# Patient Record
Sex: Female | Born: 1975 | Hispanic: Yes | Marital: Married | Smoking: Former smoker
Health system: Southern US, Community
[De-identification: ages and names within clinical notes are randomized; demographics above are authoritative.]

## PROBLEM LIST (undated history)

## (undated) DIAGNOSIS — I10 Essential (primary) hypertension: Secondary | ICD-10-CM

## (undated) DIAGNOSIS — K219 Gastro-esophageal reflux disease without esophagitis: Secondary | ICD-10-CM

## (undated) HISTORY — PX: OTHER SURGICAL HISTORY: SHX169

## (undated) HISTORY — PX: LAPAROSCOPIC GASTRIC SLEEVE RESECTION: SHX5895

## (undated) HISTORY — PX: PILONIDAL CYST EXCISION: SHX744

## (undated) HISTORY — PX: CHOLECYSTECTOMY: SHX55

## (undated) HISTORY — PX: GALLBLADDER SURGERY: SHX652

---

## 2005-12-23 ENCOUNTER — Other Ambulatory Visit: Payer: Self-pay

## 2005-12-23 ENCOUNTER — Emergency Department: Payer: Self-pay | Admitting: Emergency Medicine

## 2005-12-29 ENCOUNTER — Emergency Department: Payer: Self-pay | Admitting: Internal Medicine

## 2006-02-10 ENCOUNTER — Emergency Department: Payer: Self-pay | Admitting: Emergency Medicine

## 2006-04-21 ENCOUNTER — Emergency Department: Payer: Self-pay | Admitting: Emergency Medicine

## 2006-06-06 ENCOUNTER — Emergency Department: Payer: Self-pay | Admitting: Emergency Medicine

## 2006-08-31 ENCOUNTER — Emergency Department: Payer: Self-pay | Admitting: Emergency Medicine

## 2007-05-19 ENCOUNTER — Emergency Department: Payer: Self-pay | Admitting: Emergency Medicine

## 2007-07-13 ENCOUNTER — Emergency Department: Payer: Self-pay | Admitting: Emergency Medicine

## 2008-02-23 ENCOUNTER — Emergency Department: Payer: Self-pay | Admitting: Emergency Medicine

## 2008-03-12 ENCOUNTER — Emergency Department: Payer: Self-pay | Admitting: Emergency Medicine

## 2008-04-14 ENCOUNTER — Emergency Department: Payer: Self-pay | Admitting: Emergency Medicine

## 2011-02-24 HISTORY — PX: LAPAROSCOPIC GASTRIC SLEEVE RESECTION: SHX5895

## 2013-02-22 ENCOUNTER — Emergency Department: Payer: Self-pay | Admitting: Emergency Medicine

## 2013-03-07 ENCOUNTER — Encounter (HOSPITAL_COMMUNITY): Payer: Self-pay | Admitting: Emergency Medicine

## 2013-03-07 ENCOUNTER — Emergency Department (HOSPITAL_COMMUNITY)
Admission: EM | Admit: 2013-03-07 | Discharge: 2013-03-07 | Disposition: A | Discharge status: Home / Self Care | Payer: No Typology Code available for payment source | Attending: Emergency Medicine | Admitting: Emergency Medicine

## 2013-03-07 ENCOUNTER — Emergency Department (HOSPITAL_COMMUNITY): Payer: No Typology Code available for payment source

## 2013-03-07 DIAGNOSIS — Z91040 Latex allergy status: Secondary | ICD-10-CM | POA: Insufficient documentation

## 2013-03-07 DIAGNOSIS — R110 Nausea: Secondary | ICD-10-CM | POA: Insufficient documentation

## 2013-03-07 DIAGNOSIS — R42 Dizziness and giddiness: Secondary | ICD-10-CM | POA: Insufficient documentation

## 2013-03-07 DIAGNOSIS — IMO0002 Reserved for concepts with insufficient information to code with codable children: Secondary | ICD-10-CM | POA: Insufficient documentation

## 2013-03-07 DIAGNOSIS — Y92410 Unspecified street and highway as the place of occurrence of the external cause: Secondary | ICD-10-CM | POA: Insufficient documentation

## 2013-03-07 DIAGNOSIS — S0993XA Unspecified injury of face, initial encounter: Secondary | ICD-10-CM | POA: Insufficient documentation

## 2013-03-07 DIAGNOSIS — S199XXA Unspecified injury of neck, initial encounter: Secondary | ICD-10-CM

## 2013-03-07 DIAGNOSIS — T148XXA Other injury of unspecified body region, initial encounter: Secondary | ICD-10-CM

## 2013-03-07 DIAGNOSIS — Y9389 Activity, other specified: Secondary | ICD-10-CM | POA: Insufficient documentation

## 2013-03-07 DIAGNOSIS — F17200 Nicotine dependence, unspecified, uncomplicated: Secondary | ICD-10-CM | POA: Insufficient documentation

## 2013-03-07 DIAGNOSIS — Z8632 Personal history of gestational diabetes: Secondary | ICD-10-CM | POA: Insufficient documentation

## 2013-03-07 MED ORDER — TRAMADOL HCL 50 MG PO TABS: 50.0000 mg | ORAL_TABLET | Freq: Four times a day (QID) | ORAL | Status: DC | PRN

## 2013-03-07 MED ORDER — HYDROCODONE-ACETAMINOPHEN 5-325 MG PO TABS
1.0000 | ORAL_TABLET | Freq: Once | ORAL | Status: AC
Start: 2013-03-07 — End: 2013-03-07
  Administered 2013-03-07: 1 via ORAL
  Filled 2013-03-07: qty 1

## 2013-03-07 MED ORDER — CYCLOBENZAPRINE HCL 10 MG PO TABS: 10.0000 mg | ORAL_TABLET | Freq: Three times a day (TID) | ORAL | Status: DC | PRN

## 2013-03-07 MED ORDER — ONDANSETRON 8 MG PO TBDP
8.0000 mg | ORAL_TABLET | Freq: Once | ORAL | Status: AC
  Administered 2013-03-07: 8 mg via ORAL
  Filled 2013-03-07: qty 1

## 2013-03-07 MED ORDER — IBUPROFEN 800 MG PO TABS
800.0000 mg | ORAL_TABLET | Freq: Once | ORAL | Status: DC
  Filled 2013-03-07: qty 1
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
## 2013-03-07 NOTE — ED Notes (Signed)
Spinal board removed from patient. She has mid to lower back pain upon palpation. She c/o lt shoulder pain.
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
## 2013-03-07 NOTE — ED Provider Notes (Signed)
Medical screening examination/treatment/procedure(s) were performed by non-physician practitioner and as supervising physician I was immediately available for consultation/collaboration.  EKG Interpretation   None         Cathye Kreiter M Deyra Perdomo, DO 03/07/13 1532 

## 2013-03-07 NOTE — ED Notes (Signed)
Bed: WA22 Expected date:  Expected time:  Means of arrival:  Comments: EMS-[facility] 

## 2013-06-21 ENCOUNTER — Emergency Department: Payer: Self-pay | Admitting: Emergency Medicine

## 2013-06-21 LAB — URINALYSIS, COMPLETE
BLOOD: NEGATIVE
Bacteria: NONE SEEN
Bilirubin,UR: NEGATIVE
Glucose,UR: NEGATIVE mg/dL (ref 0–75)
Ketone: NEGATIVE
Nitrite: NEGATIVE
PH: 5 (ref 4.5–8.0)
Protein: NEGATIVE
RBC,UR: 3 /HPF (ref 0–5)
Specific Gravity: 1.023 (ref 1.003–1.030)
Squamous Epithelial: 4
WBC UR: 2 /HPF (ref 0–5)

## 2013-06-21 LAB — COMPREHENSIVE METABOLIC PANEL
ALT: 43 U/L (ref 12–78)
Albumin: 3.6 g/dL (ref 3.4–5.0)
Alkaline Phosphatase: 48 U/L
Anion Gap: 8 (ref 7–16)
BILIRUBIN TOTAL: 0.6 mg/dL (ref 0.2–1.0)
BUN: 6 mg/dL — AB (ref 7–18)
CHLORIDE: 105 mmol/L (ref 98–107)
CO2: 24 mmol/L (ref 21–32)
Calcium, Total: 8.9 mg/dL (ref 8.5–10.1)
Creatinine: 0.53 mg/dL — ABNORMAL LOW (ref 0.60–1.30)
EGFR (Non-African Amer.): >60
Glucose: 76 mg/dL (ref 65–99)
Osmolality: 270 (ref 275–301)
Potassium: 3.8 mmol/L (ref 3.5–5.1)
SGOT(AST): 21 U/L (ref 15–37)
Sodium: 137 mmol/L (ref 136–145)
Total Protein: 7.8 g/dL (ref 6.4–8.2)

## 2013-06-21 LAB — HCG, QUANTITATIVE, PREGNANCY: BETA HCG, QUANT.: 131184 m[IU]/mL — AB

## 2013-06-21 LAB — CBC
HCT: 42.4 % (ref 35.0–47.0)
HGB: 14.3 g/dL (ref 12.0–16.0)
MCH: 31 pg (ref 26.0–34.0)
MCHC: 33.8 g/dL (ref 32.0–36.0)
MCV: 92 fL (ref 80–100)
PLATELETS: 192 10*3/uL (ref 150–440)
RBC: 4.61 10*6/uL (ref 3.80–5.20)
RDW: 12.3 % (ref 11.5–14.5)
WBC: 9.7 10*3/uL (ref 3.6–11.0)

## 2013-06-21 LAB — GC/CHLAMYDIA PROBE AMP

## 2013-06-21 LAB — WET PREP, GENITAL

## 2013-11-20 ENCOUNTER — Emergency Department: Payer: Self-pay | Admitting: Emergency Medicine

## 2013-12-05 ENCOUNTER — Emergency Department: Payer: Self-pay | Admitting: Emergency Medicine

## 2014-01-16 DIAGNOSIS — H5213 Myopia, bilateral: Secondary | ICD-10-CM | POA: Insufficient documentation

## 2014-03-26 DIAGNOSIS — R079 Chest pain, unspecified: Secondary | ICD-10-CM | POA: Insufficient documentation

## 2014-04-30 DIAGNOSIS — Z3009 Encounter for other general counseling and advice on contraception: Secondary | ICD-10-CM | POA: Insufficient documentation

## 2014-04-30 DIAGNOSIS — N760 Acute vaginitis: Secondary | ICD-10-CM | POA: Insufficient documentation

## 2014-05-07 DIAGNOSIS — R87610 Atypical squamous cells of undetermined significance on cytologic smear of cervix (ASC-US): Secondary | ICD-10-CM | POA: Insufficient documentation

## 2014-11-13 DIAGNOSIS — F4310 Post-traumatic stress disorder, unspecified: Secondary | ICD-10-CM | POA: Insufficient documentation

## 2014-11-13 DIAGNOSIS — F332 Major depressive disorder, recurrent severe without psychotic features: Secondary | ICD-10-CM | POA: Insufficient documentation

## 2014-11-28 ENCOUNTER — Encounter: Payer: Self-pay | Admitting: *Deleted

## 2014-11-28 DIAGNOSIS — Z91040 Latex allergy status: Secondary | ICD-10-CM | POA: Insufficient documentation

## 2014-11-28 DIAGNOSIS — T8131XA Disruption of external operation (surgical) wound, not elsewhere classified, initial encounter: Secondary | ICD-10-CM | POA: Insufficient documentation

## 2014-11-28 DIAGNOSIS — Y838 Other surgical procedures as the cause of abnormal reaction of the patient, or of later complication, without mention of misadventure at the time of the procedure: Secondary | ICD-10-CM | POA: Insufficient documentation

## 2014-11-28 DIAGNOSIS — Z79899 Other long term (current) drug therapy: Secondary | ICD-10-CM | POA: Insufficient documentation

## 2014-11-28 DIAGNOSIS — Z880 Allergy status to penicillin: Secondary | ICD-10-CM | POA: Insufficient documentation

## 2014-11-28 LAB — CBC
HCT: 37.5 % (ref 35.0–47.0)
HEMOGLOBIN: 12.8 g/dL (ref 12.0–16.0)
MCH: 30.6 pg (ref 26.0–34.0)
MCHC: 34.2 g/dL (ref 32.0–36.0)
MCV: 89.5 fL (ref 80.0–100.0)
Platelets: 275 10*3/uL (ref 150–440)
RBC: 4.19 MIL/uL (ref 3.80–5.20)
RDW: 12.8 % (ref 11.5–14.5)
WBC: 9.3 10*3/uL (ref 3.6–11.0)

## 2014-11-28 NOTE — ED Notes (Signed)
Pt reports that she had a laparoscopic procedure for a tubal done on the 27th at [facility]. Pt presents tonight c/o lower abdominal pain and bleeding from the laparoscopic site just below her naval. She said she just placed the dressing about 5 minutes ago and it was noted to be saturated in triage. New dressing placed.

## 2014-11-29 ENCOUNTER — Emergency Department
Admission: EM | Admit: 2014-11-29 | Discharge: 2014-11-29 | Disposition: A | Discharge status: Home / Self Care | Payer: Self-pay | Attending: Emergency Medicine | Admitting: Emergency Medicine

## 2014-11-29 DIAGNOSIS — T8131XA Disruption of external operation (surgical) wound, not elsewhere classified, initial encounter: Secondary | ICD-10-CM

## 2014-11-29 MED ORDER — ACETAMINOPHEN 325 MG PO TABS
650.0000 mg | ORAL_TABLET | Freq: Once | ORAL | Status: AC
  Administered 2014-11-29: 650 mg via ORAL
  Filled 2014-11-29: qty 2

## 2014-11-29 NOTE — ED Provider Notes (Signed)
[facility] Emergency Department Provider Note   ____________________________________________  Time seen: 64  I have reviewed the triage vital signs and the nursing notes.   HISTORY  Chief Complaint Post-op Problem   History limited by: Not Limited   HPI Ana Orr is a 39 y.o. female who presented to the emergency department today with concerns for bleeding from an umbilical incision site. The patient states that she had the operation performed roughly 1 week ago. She was supposed to get a tubal ligation however after the umbilical port site was incised they stopped the surgery stating the morning unable to get past the muscle according to the patient. She states that since that time she has had pain around that incision site. Today she started noticing some bleeding from that site. She denies having any bleeding disorders or being on any blood thinners. She doesn't smoking. No fevers.   Past Medical History  Diagnosis Date  . Gestational diabetes     There are no active problems to display for this patient.   Past Surgical History  Procedure Laterality Date  . Laparoscopic gastric sleeve resection    . Left breast augmentation    . Pilonidal cyst excision    . Cholecystectomy    . Tummy tuck      Current Outpatient Rx  Name  Route  Sig  Dispense  Refill  . docusate sodium (COLACE) 100 MG capsule   Oral   Take 1 capsule by mouth 2 (two) times daily.      0   . hydrOXYzine (ATARAX/VISTARIL) 25 MG tablet   Oral   Take 1 tablet by mouth every 6 (six) hours.      0   . ibuprofen (ADVIL,MOTRIN) 800 MG tablet   Oral   Take 1 tablet by mouth every 8 (eight) hours as needed.      0   . oxyCODONE-acetaminophen (PERCOCET/ROXICET) 5-325 MG tablet   Oral   Take 1-2 tablets by mouth every 4 (four) hours as needed.      0   . promethazine (PHENERGAN) 12.5 MG tablet   Oral   Take 1 tablet by mouth every 6 (six) hours.      0   .  traZODone (DESYREL) 50 MG tablet   Oral   Take 0.5-2 tablets by mouth at bedtime.      0   . venlafaxine XR (EFFEXOR-XR) 37.5 MG 24 hr capsule   Oral   Take 1 capsule by mouth 4 (four) times daily.      1   . cyclobenzaprine (FLEXERIL) 10 MG tablet   Oral   Take 1 tablet (10 mg total) by mouth 3 (three) times daily as needed for muscle spasms.   30 tablet   0   . traMADol (ULTRAM) 50 MG tablet   Oral   Take 1 tablet (50 mg total) by mouth every 6 (six) hours as needed.   15 tablet   0     Allergies Asa; Latex; Nsaids; and Other  No family history on file.  Social History Social History  Substance Use Topics  . Smoking status: Light Tobacco Smoker    Types: Cigarettes  . Smokeless tobacco: None  . Alcohol Use: No    Review of Systems  Constitutional: Negative for fever. Cardiovascular: Negative for chest pain. Respiratory: Negative for shortness of breath. Gastrointestinal: Positive for abdominal pain around the site of the incision Genitourinary: Negative for dysuria. Musculoskeletal: Negative for back  pain. Skin: Negative for rash. Bleeding from umbilical incision site Neurological: Negative for headaches, focal weakness or numbness.   10-point ROS otherwise negative.  ____________________________________________   PHYSICAL EXAM:  VITAL SIGNS: ED Triage Vitals  Enc Vitals Group     BP 11/28/14 2249 135/85 mmHg     Pulse Rate 11/28/14 2249 92     Resp 11/28/14 2249 16     Temp 11/28/14 2249 98.6 F (37 C)     Temp Source 11/28/14 2249 Oral     SpO2 11/28/14 2249 100 %     Weight 11/28/14 2249 145 lb (65.772 kg)     Height 11/28/14 2249  (1.6 m)     Head Cir --      Peak Flow --      Pain Score 11/28/14 2250 8   Constitutional: Alert and oriented. Well appearing and in no distress. Eyes: Conjunctivae are normal. PERRL. Normal extraocular movements. ENT   Head: Normocephalic and atraumatic.   Nose: No congestion/rhinnorhea.    Mouth/Throat: Mucous membranes are moist.   Neck: No stridor. Hematological/Lymphatic/Immunilogical: No cervical lymphadenopathy. Cardiovascular: Normal rate, regular rhythm.  No murmurs, rubs, or gallops. Respiratory: Normal respiratory effort without tachypnea nor retractions. Breath sounds are clear and equal bilaterally. No wheezes/rales/rhonchi. Gastrointestinal: Soft and nontender. No distention.  Genitourinary: Deferred Musculoskeletal: Normal range of motion in all extremities. No joint effusions.  No lower extremity tenderness nor edema. Neurologic:  Normal speech and language. No gross focal neurologic deficits are appreciated. Speech is normal.  Skin:  Skin is warm, dry. Bleeding from umbilical incision site. Mild tenderness around that area. Skin color change. No warmth. Psychiatric: Mood and affect are normal. Speech and behavior are normal. Patient exhibits appropriate insight and judgment.  ____________________________________________    LABS (pertinent positives/negatives)  Labs Reviewed  CBC     ____________________________________________   EKG  None  ____________________________________________    RADIOLOGY  None   ____________________________________________   PROCEDURES  Procedure(s) performed: None  Critical Care performed: No  ____________________________________________   INITIAL IMPRESSION / ASSESSMENT AND PLAN / ED COURSE  Pertinent labs & imaging results that were available during my care of the patient were reviewed by me and considered in my medical decision making (see chart for details).  Patient presents to the emergency department with the dehiscence of umbilical incision site. Patient did have some bleeding and has some tenderness. I do wonder if patient could be having a hematoma that is currently dissolving. I attempted to Dermabond the incision site closed with normal success. I then placed Surgicel. Instructed patient to  follow-up with her surgeons. At this point I don't have any concern for any infection or significant blood loss.  ____________________________________________   FINAL CLINICAL IMPRESSION(S) / ED DIAGNOSES  Final diagnoses:  Dehiscence of incision, initial encounter     Phineas Semen, MD 11/29/14 [phone]

## 2014-11-29 NOTE — ED Notes (Signed)
Pt presents to ED with c/o abdominal pain and surgical incision bleeding following a tubal ligation. Pt is A&O, in NAD, respirations regular, even and unlabored.

## 2014-11-29 NOTE — Discharge Instructions (Signed)
Please seek medical attention for any high fevers, chest pain, shortness of breath, change in behavior, persistent vomiting, bloody stool or any other new or concerning symptoms.   Wound Dehiscence Wound dehiscence is when a surgical cut (incision) breaks open and does not heal properly after surgery. It usually happens 7-10 days after surgery. This can be a serious condition. It is important to identify and treat this condition early.  CAUSES  Some common causes of wound dehiscence include:  Stretching of the wound area. This may be caused by lifting, vomiting, violent coughing, or straining during bowel movements.  Wound infection.  Early stitch (suture) removal. RISK FACTORS Various things can increase your risk of developing wound dehiscence, including:  Obesity.  Lung disease.  Smoking.  Poor nutrition.  Contamination during surgery. SIGNS AND SYMPTOMS  Bleeding from the wound.  Pain.  Fever.  Wound starts breaking open. DIAGNOSIS  Your health care provider may diagnose wound dehiscence by monitoring the incision and noting any changes in the wound. These changes can include an increase in drainage or pain. The health care provider may also ask you if you have noticed any stretching or tearing of the wound.  Wound cultures may be taken to determine if there is an infection.  Imaging studies, such as an MRI scan or CT scan, may be done to determine if there is a collection of pus or fluid in the wound area. TREATMENT Treatment may include:  Wound care.  Surgical repair.  Antibiotic medicine to treat or prevent infection.  Medicines to reduce pain and swelling. HOME CARE INSTRUCTIONS   Only take over-the-counter or prescription medicines for pain, discomfort, or fever as directed by your health care provider. Taking pain medicine 30 minutes before changing a bandage (dressing) can help relieve pain.  Take your antibiotics as directed. Finish them even if  you start to feel better.  Gently wash the area with mild soap and water 2 times a day, or as directed. Rinse off the soap. Pat the area dry with a clean towel. Do not rub the wound. This may cause bleeding.  Follow your health care provider's instructions for how often you need to change the dressing and packing inside. Wash your hands well before and after changing your dressing. Apply a dressing to the wound as directed.  Take showers. Do not soak the wound, bathe, swim, or use a hot tub until directed by your health care provider.  Avoid exercises that make you sweat heavily.  Use anti-itch medicine as directed by your health care provider. The wound may itch when it is healing. Do not pick or scratch at the wound.  Do not lift more than 10 pounds (4.5 kg) until the wound is healed, or as directed by your health care provider.  Keep all follow-up appointments as directed. SEEK MEDICAL CARE IF:  You have excessive bleeding from your surgical wound.  Your wound does not seem to be healing properly.  You have a fever. SEEK IMMEDIATE MEDICAL CARE IF:   You have increased swelling or redness around the wound.  You have increasing pain in the wound.  You have an increasing amount of pus coming from the wound.  Your wound breaks open farther. MAKE SURE YOU:   Understand these instructions.  Will watch your condition.  Will get help right away if you are not doing well or get worse.   This information is not intended to replace advice given to you by your health care  provider. Make sure you discuss any questions you have with your health care provider.   Document Released: 05/02/2003 Document Revised: 02/14/2013 Document Reviewed: 10/17/2012 Elsevier Interactive Patient Education [employer].

## 2015-03-28 DIAGNOSIS — R109 Unspecified abdominal pain: Secondary | ICD-10-CM | POA: Insufficient documentation

## 2015-04-18 DIAGNOSIS — R87810 Cervical high risk human papillomavirus (HPV) DNA test positive: Secondary | ICD-10-CM | POA: Insufficient documentation

## 2015-04-18 DIAGNOSIS — R87611 Atypical squamous cells cannot exclude high grade squamous intraepithelial lesion on cytologic smear of cervix (ASC-H): Secondary | ICD-10-CM | POA: Insufficient documentation

## 2015-04-29 DIAGNOSIS — R030 Elevated blood-pressure reading, without diagnosis of hypertension: Secondary | ICD-10-CM | POA: Insufficient documentation

## 2015-04-29 DIAGNOSIS — G43919 Migraine, unspecified, intractable, without status migrainosus: Secondary | ICD-10-CM | POA: Insufficient documentation

## 2015-06-21 ENCOUNTER — Emergency Department: Payer: Medicaid Other

## 2015-06-21 ENCOUNTER — Emergency Department
Admission: EM | Admit: 2015-06-21 | Discharge: 2015-06-21 | Disposition: A | Discharge status: Home / Self Care | Payer: Medicaid Other | Attending: Emergency Medicine | Admitting: Emergency Medicine

## 2015-06-21 DIAGNOSIS — N939 Abnormal uterine and vaginal bleeding, unspecified: Secondary | ICD-10-CM | POA: Diagnosis present

## 2015-06-21 DIAGNOSIS — Z91040 Latex allergy status: Secondary | ICD-10-CM | POA: Insufficient documentation

## 2015-06-21 DIAGNOSIS — F17210 Nicotine dependence, cigarettes, uncomplicated: Secondary | ICD-10-CM | POA: Diagnosis not present

## 2015-06-21 DIAGNOSIS — B009 Herpesviral infection, unspecified: Secondary | ICD-10-CM | POA: Insufficient documentation

## 2015-06-21 DIAGNOSIS — Z3A01 Less than 8 weeks gestation of pregnancy: Secondary | ICD-10-CM | POA: Insufficient documentation

## 2015-06-21 DIAGNOSIS — O200 Threatened abortion: Secondary | ICD-10-CM

## 2015-06-21 DIAGNOSIS — Z791 Long term (current) use of non-steroidal anti-inflammatories (NSAID): Secondary | ICD-10-CM | POA: Diagnosis not present

## 2015-06-21 DIAGNOSIS — Z79899 Other long term (current) drug therapy: Secondary | ICD-10-CM | POA: Insufficient documentation

## 2015-06-21 LAB — CBC
HCT: 42 % (ref 35.0–47.0)
Hemoglobin: 14.4 g/dL (ref 12.0–16.0)
MCH: 30.4 pg (ref 26.0–34.0)
MCHC: 34.2 g/dL (ref 32.0–36.0)
MCV: 89 fL (ref 80.0–100.0)
PLATELETS: 207 10*3/uL (ref 150–440)
RBC: 4.72 MIL/uL (ref 3.80–5.20)
RDW: 13.4 % (ref 11.5–14.5)
WBC: 7.7 10*3/uL (ref 3.6–11.0)

## 2015-06-21 LAB — ANTIBODY SCREEN: ANTIBODY SCREEN: NEGATIVE

## 2015-06-21 LAB — ABO/RH: ABO/RH(D): O NEG

## 2015-06-21 LAB — HCG, QUANTITATIVE, PREGNANCY: HCG, BETA CHAIN, QUANT, S: 4790 m[IU]/mL — AB (ref <5)

## 2015-06-21 MED ORDER — RHO D IMMUNE GLOBULIN 1500 UNIT/2ML IJ SOSY
300.0000 ug | PREFILLED_SYRINGE | Freq: Once | INTRAMUSCULAR | Status: AC
  Administered 2015-06-21: 300 ug via INTRAMUSCULAR
  Filled 2015-06-21: qty 2

## 2015-06-21 NOTE — Discharge Instructions (Signed)
Threatened Miscarriage °A threatened miscarriage occurs when you have vaginal bleeding during your first 20 weeks of pregnancy but the pregnancy has not ended. If you have vaginal bleeding during this time, your health care provider will do tests to make sure you are still pregnant. If the tests show you are still pregnant and the developing baby (fetus) inside your womb (uterus) is still growing, your condition is considered a threatened miscarriage. °A threatened miscarriage does not mean your pregnancy will end, but it does increase the risk of losing your pregnancy (complete miscarriage). °CAUSES  °The cause of a threatened miscarriage is usually not known. If you go on to have a complete miscarriage, the most common cause is an abnormal number of chromosomes in the developing baby. Chromosomes are the structures inside cells that hold all your genetic material. °Some causes of vaginal bleeding that do not result in miscarriage include: °· Having sex. °· Having an infection. °· Normal hormone changes of pregnancy. °· Bleeding that occurs when an egg implants in your uterus. °RISK FACTORS °Risk factors for bleeding in early pregnancy include: °· Obesity. °· Smoking. °· Drinking excessive amounts of alcohol or caffeine. °· Recreational drug use. °SIGNS AND SYMPTOMS °· Light vaginal bleeding. °· Mild abdominal pain or cramps. °DIAGNOSIS  °If you have bleeding with or without abdominal pain before 20 weeks of pregnancy, your health care provider will do tests to check whether you are still pregnant. One important test involves using sound waves and a computer (ultrasound) to create images of the inside of your uterus. Other tests include an internal exam of your vagina and uterus (pelvic exam) and measurement of your baby's heart rate.  °You may be diagnosed with a threatened miscarriage if: °· Ultrasound testing shows you are still pregnant. °· Your baby's heart rate is strong. °· A pelvic exam shows that the  opening between your uterus and your vagina (cervix) is closed. °· Your heart rate and blood pressure are stable. °· Blood tests confirm you are still pregnant. °TREATMENT  °No treatments have been shown to prevent a threatened miscarriage from going on to a complete miscarriage. However, the right home care is important.  °HOME CARE INSTRUCTIONS  °· Make sure you keep all your appointments for prenatal care. This is very important. °· Get plenty of rest. °· Do not have sex or use tampons if you have vaginal bleeding. °· Do not douche. °· Do not smoke or use recreational drugs. °· Do not drink alcohol. °· Avoid caffeine. °SEEK MEDICAL CARE IF: °· You have light vaginal bleeding or spotting while pregnant. °· You have abdominal pain or cramping. °· You have a fever. °SEEK IMMEDIATE MEDICAL CARE IF: °· You have heavy vaginal bleeding. °· You have blood clots coming from your vagina. °· You have severe low back pain or abdominal cramps. °· You have fever, chills, and severe abdominal pain. °MAKE SURE YOU: °· Understand these instructions. °· Will watch your condition. °· Will get help right away if you are not doing well or get worse. °  °This information is not intended to replace advice given to you by your health care provider. Make sure you discuss any questions you have with your health care provider. °  °Document Released: 02/09/2005 Document Revised: 02/14/2013 Document Reviewed: 12/06/2012 °Elsevier Interactive Patient Education ©2016 [employer] ° °Vaginal Bleeding During Pregnancy, First Trimester °A small amount of bleeding (spotting) from the vagina is relatively common in early pregnancy. It usually stops on its own.   Various things may cause bleeding or spotting in early pregnancy. Some bleeding may be related to the pregnancy, and some may not. In most cases, the bleeding is normal and is not a problem. However, bleeding can also be a sign of something serious. Be sure to tell your health care provider  about any vaginal bleeding right away. °Some possible causes of vaginal bleeding during the first trimester include: °· Infection or inflammation of the cervix. °· Growths (polyps) on the cervix. °· Miscarriage or threatened miscarriage. °· Pregnancy tissue has developed outside of the uterus and in a fallopian tube (tubal pregnancy). °· Tiny cysts have developed in the uterus instead of pregnancy tissue (molar pregnancy). °HOME CARE INSTRUCTIONS  °Watch your condition for any changes. The following actions may help to lessen any discomfort you are feeling: °· Follow your health care provider's instructions for limiting your activity. If your health care provider orders bed rest, you may need to stay in bed and only get up to use the bathroom. However, your health care provider may allow you to continue light activity. °· If needed, make plans for someone to help with your regular activities and responsibilities while you are on bed rest. °· Keep track of the number of pads you use each day, how often you change pads, and how soaked (saturated) they are. Write this down. °· Do not use tampons. Do not douche. °· Do not have sexual intercourse or orgasms until approved by your health care provider. °· If you pass any tissue from your vagina, save the tissue so you can show it to your health care provider. °· Only take over-the-counter or prescription medicines as directed by your health care provider. °· Do not take aspirin because it can make you bleed. °· Keep all follow-up appointments as directed by your health care provider. °SEEK MEDICAL CARE IF: °· You have any vaginal bleeding during any part of your pregnancy. °· You have cramps or labor pains. °· You have a fever, not controlled by medicine. °SEEK IMMEDIATE MEDICAL CARE IF:  °· You have severe cramps in your back or belly (abdomen). °· You pass large clots or tissue from your vagina. °· Your bleeding increases. °· You feel light-headed or weak, or you have  fainting episodes. °· You have chills. °· You are leaking fluid or have a gush of fluid from your vagina. °· You pass out while having a bowel movement. °MAKE SURE YOU: °· Understand these instructions. °· Will watch your condition. °· Will get help right away if you are not doing well or get worse. °  °This information is not intended to replace advice given to you by your health care provider. Make sure you discuss any questions you have with your health care provider. °  °Document Released: 11/19/2004 Document Revised: 02/14/2013 Document Reviewed: 10/17/2012 °Elsevier Interactive Patient Education ©2016 [employer]vier Inc[employer] ° °

## 2015-06-21 NOTE — ED Provider Notes (Signed)
[facility] Emergency Department Provider Note  ____________________________________________    I have reviewed the triage vital signs and the nursing notes.   HISTORY  Chief Complaint Vaginal Bleeding    HPI Ana Orr is a 40 y.o. female who presents with complaints of vaginal bleeding and pelvic cramping. Patient reports she developed bleeding today while trying to go to the bathroom and pelvic cramping which is moderate in nature. Her bleeding has improved and now she is only spotting. She reports she is [redacted] weeks pregnant. This is her third pregnancy. No dizziness. No nausea or vomiting   History reviewed. No pertinent past medical history.  There are no active problems to display for this patient.   Past Surgical History  Procedure Laterality Date  . Laparoscopic gastric sleeve resection    . Left breast augmentation    . Pilonidal cyst excision    . Cholecystectomy    . Tummy tuck      Current Outpatient Rx  Name  Route  Sig  Dispense  Refill  . cyclobenzaprine (FLEXERIL) 10 MG tablet   Oral   Take 1 tablet (10 mg total) by mouth 3 (three) times daily as needed for muscle spasms.   30 tablet   0   . docusate sodium (COLACE) 100 MG capsule   Oral   Take 1 capsule by mouth 2 (two) times daily.      0   . hydrOXYzine (ATARAX/VISTARIL) 25 MG tablet   Oral   Take 1 tablet by mouth every 6 (six) hours.      0   . ibuprofen (ADVIL,MOTRIN) 800 MG tablet   Oral   Take 1 tablet by mouth every 8 (eight) hours as needed.      0   . oxyCODONE-acetaminophen (PERCOCET/ROXICET) 5-325 MG tablet   Oral   Take 1-2 tablets by mouth every 4 (four) hours as needed.      0   . promethazine (PHENERGAN) 12.5 MG tablet   Oral   Take 1 tablet by mouth every 6 (six) hours.      0   . traMADol (ULTRAM) 50 MG tablet   Oral   Take 1 tablet (50 mg total) by mouth every 6 (six) hours as needed.   15 tablet   0   . traZODone (DESYREL) 50 MG  tablet   Oral   Take 0.5-2 tablets by mouth at bedtime.      0   . venlafaxine XR (EFFEXOR-XR) 37.5 MG 24 hr capsule   Oral   Take 1 capsule by mouth 4 (four) times daily.      1     Allergies Asa; Latex; Nsaids; and Other  No family history on file.  Social History Social History  Substance Use Topics  . Smoking status: Light Tobacco Smoker    Types: Cigarettes  . Smokeless tobacco: None  . Alcohol Use: No    Review of Systems  Constitutional: Negative for fever. Eyes: Negative for redness ENT: Negative for sore throat Cardiovascular: Negative for chest pain Respiratory: Negative for shortness of breath. Gastrointestinal: Pelvic cramping bilaterally Genitourinary: Negative for dysuria. Positive for vaginal spotting Musculoskeletal: Negative for back pain. Skin: Negative for rash. Neurological: Negative for focal weakness Psychiatric: no anxiety    ____________________________________________   PHYSICAL EXAM:  VITAL SIGNS: ED Triage Vitals  Enc Vitals Group     BP 06/21/15 2020 110/74 mmHg     Pulse Rate 06/21/15 2020 71  Resp 06/21/15 2020 20     Temp 06/21/15 2020 98.7 F (37.1 C)     Temp Source 06/21/15 2020 Oral     SpO2 06/21/15 2020 100 %     Weight 06/21/15 2020 150 lb (68.04 kg)     Height 06/21/15 2020  (1.6 m)     Head Cir --      Peak Flow --      Pain Score 06/21/15 2023 9     Pain Loc --      Pain Edu? --      Excl. in GC? --      Constitutional: Alert and oriented. Well appearing and in no distress.  Eyes: Conjunctivae are normal. No erythema or injection ENT   Head: Normocephalic and atraumatic.   Mouth/Throat: Mucous membranes are moist. Cardiovascular: Normal rate, regular rhythm. Normal and symmetric distal pulses are present in the upper extremities.  Respiratory: Normal respiratory effort without tachypnea nor retractions. Breath sounds are clear and equal bilaterally.  Gastrointestinal: Mild tenderness to  palpation in the lower abdomen bilaterally. No distention. There is no CVA tenderness. Genitourinary: deferred Musculoskeletal: Nontender with normal range of motion in all extremities. No lower extremity tenderness nor edema. Neurologic:  Normal speech and language. No gross focal neurologic deficits are appreciated. Skin:  Skin is warm, dry and intact. No rash noted. Psychiatric: Mood and affect are normal. Patient exhibits appropriate insight and judgment.  ____________________________________________    LABS (pertinent positives/negatives)  Labs Reviewed  HCG, QUANTITATIVE, PREGNANCY - Abnormal; Notable for the following:    hCG, Beta Chain, Quant, S 4790 (*)    All other components within normal limits  CBC  ABO/RH  ANTIBODY SCREEN  RHOGAM INJECTION    ____________________________________________   EKG None  ____________________________________________    RADIOLOGY  Ultrasound shows a 5 week 5 day IUP ____________________________________________   PROCEDURES  Procedure(s) performed: none  Critical Care performed: none  ____________________________________________   INITIAL IMPRESSION / ASSESSMENT AND PLAN / ED COURSE  Pertinent labs & imaging results that were available during my care of the patient were reviewed by me and considered in my medical decision making (see chart for details).  Patient presents with vaginal bleeding/spotting. We'll check labs, obtain ultrasound  Patient is Rh-, rhogam ordered  ----------------------------------------- 10:54 PM on 06/21/2015 -----------------------------------------  Ultrasound shows a 5 week 5 day IUP. Discussed this with the patient the need for very close follow-up. Recommend Tylenol for cramping discomfort. Patient reports no further bleeding. She did receive her rhogam   ____________________________________________   FINAL CLINICAL IMPRESSION(S) / ED DIAGNOSES  Final diagnoses:  Threatened  miscarriage in early pregnancy          Jene Every, MD 06/21/15 2254

## 2015-06-21 NOTE — ED Notes (Signed)
Blood bank notified regarding RHO, will call RN once ready for pick up.

## 2015-06-21 NOTE — ED Notes (Signed)
Pt arrives to ER via POV;ambulatory. Pt states that she is having lower abdominal cramping and vaginal bleeding. States heavy bleeding earlier today and now "spotting". Pt approx [redacted] weeks pregnant. Pt alert and oriented X4, active, cooperative, pt in NAD. RR even and unlabored, color WNL.

## 2015-06-22 LAB — RHOGAM INJECTION: Unit division: 0

## 2015-06-24 DIAGNOSIS — O039 Complete or unspecified spontaneous abortion without complication: Secondary | ICD-10-CM | POA: Insufficient documentation

## 2015-07-26 DIAGNOSIS — N3000 Acute cystitis without hematuria: Secondary | ICD-10-CM | POA: Insufficient documentation

## 2015-08-20 DIAGNOSIS — K5900 Constipation, unspecified: Secondary | ICD-10-CM | POA: Insufficient documentation

## 2015-08-22 DIAGNOSIS — Z720 Tobacco use: Secondary | ICD-10-CM | POA: Insufficient documentation

## 2015-12-20 DIAGNOSIS — Z9109 Other allergy status, other than to drugs and biological substances: Secondary | ICD-10-CM | POA: Insufficient documentation

## 2016-01-14 DIAGNOSIS — R399 Unspecified symptoms and signs involving the genitourinary system: Secondary | ICD-10-CM | POA: Insufficient documentation

## 2016-01-14 DIAGNOSIS — G44219 Episodic tension-type headache, not intractable: Secondary | ICD-10-CM | POA: Insufficient documentation

## 2017-02-28 DIAGNOSIS — Z903 Acquired absence of stomach [part of]: Secondary | ICD-10-CM | POA: Insufficient documentation

## 2017-03-17 DIAGNOSIS — K219 Gastro-esophageal reflux disease without esophagitis: Secondary | ICD-10-CM | POA: Insufficient documentation

## 2017-12-27 DIAGNOSIS — K279 Peptic ulcer, site unspecified, unspecified as acute or chronic, without hemorrhage or perforation: Secondary | ICD-10-CM | POA: Insufficient documentation

## 2018-05-09 ENCOUNTER — Emergency Department (HOSPITAL_BASED_OUTPATIENT_CLINIC_OR_DEPARTMENT_OTHER): Payer: Medicaid Other

## 2018-05-09 ENCOUNTER — Other Ambulatory Visit: Payer: Self-pay

## 2018-05-09 ENCOUNTER — Emergency Department (HOSPITAL_BASED_OUTPATIENT_CLINIC_OR_DEPARTMENT_OTHER)
Admission: EM | Admit: 2018-05-09 | Discharge: 2018-05-09 | Disposition: A | Discharge status: Home / Self Care | Payer: Medicaid Other | Attending: Emergency Medicine | Admitting: Emergency Medicine

## 2018-05-09 ENCOUNTER — Encounter (HOSPITAL_BASED_OUTPATIENT_CLINIC_OR_DEPARTMENT_OTHER): Payer: Self-pay

## 2018-05-09 DIAGNOSIS — F17210 Nicotine dependence, cigarettes, uncomplicated: Secondary | ICD-10-CM | POA: Insufficient documentation

## 2018-05-09 DIAGNOSIS — R0789 Other chest pain: Secondary | ICD-10-CM | POA: Diagnosis not present

## 2018-05-09 DIAGNOSIS — Z91040 Latex allergy status: Secondary | ICD-10-CM | POA: Insufficient documentation

## 2018-05-09 DIAGNOSIS — M542 Cervicalgia: Secondary | ICD-10-CM | POA: Diagnosis not present

## 2018-05-09 DIAGNOSIS — M79622 Pain in left upper arm: Secondary | ICD-10-CM | POA: Diagnosis not present

## 2018-05-09 DIAGNOSIS — Z79899 Other long term (current) drug therapy: Secondary | ICD-10-CM | POA: Diagnosis not present

## 2018-05-09 DIAGNOSIS — I10 Essential (primary) hypertension: Secondary | ICD-10-CM | POA: Insufficient documentation

## 2018-05-09 HISTORY — DX: Gastro-esophageal reflux disease without esophagitis: K21.9

## 2018-05-09 HISTORY — DX: Essential (primary) hypertension: I10

## 2018-05-09 NOTE — ED Triage Notes (Signed)
Pt states that approximately an hour ago, she had tightness in her left neck that radiated to left axilla, lasted for approximately 20 minutes. She denies chest pain, denies dizziness, states the pain has subsided. Attempted to make appointment with PCP, they told her to come in to ED.

## 2018-05-09 NOTE — ED Notes (Signed)
Pt verbalized understanding of dc instructions.

## 2018-05-09 NOTE — ED Provider Notes (Signed)
[facility] EMERGENCY DEPARTMENT Provider Note   CSN: [id number] Arrival date & time: 05/09/18  1627    History   Chief Complaint Chief Complaint  Patient presents with  . Arm Pain    HPI Ana Orr is a 43 y.o. female.     Patient with history of hypertension, gastric sleeve surgery presents the emergency department with complaint of left axilla pain starting approximately 1 hour prior to arrival.  Patient states that the pain was very sharp and acute onset.  She states that it felt like something "was going to pop if I move my arm" and she was having difficulty moving her arm.  She states that she needed to take shallow breaths.  Symptoms lasted for approximately 20 minutes.  She ate just prior to symptom onset.  She called her doctor who advised her to come to the emergency department.  Symptoms are now nearly resolved except for a little bit of soreness in the posterior aspect of the axilla.  No associated chest pains.  No lightheadedness or syncope.  No diaphoresis, nausea or vomiting.  No treatments prior to arrival.  She has a history of hypertension.  No history of high cholesterol, ongoing diabetes (history of gestational diabetes), current smoking (quit in 2013), family history in first-degree relatives (second-degree relatives with heart disease).  Patient denies risk factors for pulmonary embolism including: unilateral leg swelling, history of DVT/PE/other blood clots, use of exogenous hormones, recent immobilizations, recent surgery, recent travel (>4hr segment), malignancy, hemoptysis.       Past Medical History:  Diagnosis Date  . GERD (gastroesophageal reflux disease)   . Hypertension     There are no active problems to display for this patient.   Past Surgical History:  Procedure Laterality Date  . CHOLECYSTECTOMY    . LAPAROSCOPIC GASTRIC SLEEVE RESECTION    . left breast augmentation    . PILONIDAL CYST EXCISION    . tummy tuck       OB  History    Gravida  1   Para      Term      Preterm      AB      Living        SAB      TAB      Ectopic      Multiple      Live Births               Home Medications    Prior to Admission medications   Medication Sig Start Date End Date Taking? Authorizing Provider  pantoprazole (PROTONIX) 40 MG tablet TAKE 1 TABLET BY MOUTH EVERY DAY. OFFICE VISIT NEEDED FOR FURTHER REFILLS. 11/12/17  Yes [provider]  propranolol (INDERAL) 20 MG tablet TAKE 1 TABLET BY MOUTH TWICE A DAY. OFFICE VISIT NEEDED FOR FURTHER REFILLS. 03/18/17  Yes [provider]  cyclobenzaprine (FLEXERIL) 10 MG tablet Take 1 tablet (10 mg total) by mouth 3 (three) times daily as needed for muscle spasms. 03/07/13   Ivonne Andrewammen, Peter, PA-C  docusate sodium (COLACE) 100 MG capsule Take 1 capsule by mouth 2 (two) times daily. 11/20/14   [provider]  hydrOXYzine (ATARAX/VISTARIL) 25 MG tablet Take 1 tablet by mouth every 6 (six) hours. 10/30/14   [provider]  ibuprofen (ADVIL,MOTRIN) 800 MG tablet Take 1 tablet by mouth every 8 (eight) hours as needed. 11/20/14   [provider]  oxyCODONE-acetaminophen (PERCOCET/ROXICET) 5-325 MG tablet Take 1-2 tablets  by mouth every 4 (four) hours as needed. 11/22/14   [provider]  promethazine (PHENERGAN) 12.5 MG tablet Take 1 tablet by mouth every 6 (six) hours. 11/22/14   [provider]  traMADol (ULTRAM) 50 MG tablet Take 1 tablet (50 mg total) by mouth every 6 (six) hours as needed. 03/07/13   Ivonne Andrew, PA-C  traZODone (DESYREL) 50 MG tablet Take 0.5-2 tablets by mouth at bedtime. 10/30/14   [provider]  venlafaxine XR (EFFEXOR-XR) 37.5 MG 24 hr capsule Take 1 capsule by mouth 4 (four) times daily. 11/13/14   [provider]    Family History No family history on file.  Social History Social History   Tobacco Use  . Smoking status: Light Tobacco Smoker    Types:  Cigarettes  Substance Use Topics  . Alcohol use: No  . Drug use: No     Allergies   Asa [aspirin]; Latex; Nsaids; and Other   Review of Systems Review of Systems  Constitutional: Negative for diaphoresis and fever.  Eyes: Negative for redness.  Respiratory: Negative for cough and shortness of breath.   Cardiovascular: Negative for chest pain, palpitations and leg swelling.  Gastrointestinal: Negative for abdominal pain, nausea and vomiting.  Genitourinary: Negative for dysuria.  Musculoskeletal: Positive for myalgias. Negative for back pain and neck pain.  Skin: Negative for rash.  Neurological: Negative for syncope and light-headedness.  Psychiatric/Behavioral: The patient is not nervous/anxious.      Physical Exam Updated Vital Signs BP 117/75 (BP Location: Right Arm)   Pulse 72   Temp 98.3 F (36.8 C) (Oral)   Resp 18   Ht 5\' 4"  (1.626 m)   Wt 79.4 kg   LMP 05/02/2018   SpO2 100%   Breastfeeding Unknown   BMI 30.04 kg/m   Physical Exam Vitals signs and nursing note reviewed.  Constitutional:      Appearance: She is well-developed. She is not diaphoretic.  HENT:     Head: Normocephalic and atraumatic.     Mouth/Throat:     Mouth: Mucous membranes are not dry.  Eyes:     Conjunctiva/sclera: Conjunctivae normal.  Neck:     Musculoskeletal: Normal range of motion and neck supple. No muscular tenderness.     Vascular: Normal carotid pulses. No carotid bruit or JVD.     Trachea: Trachea normal. No tracheal deviation.  Cardiovascular:     Rate and Rhythm: Normal rate and regular rhythm.     Pulses: No decreased pulses.          Radial pulses are 2+ on the right side and 2+ on the left side.     Heart sounds: Normal heart sounds, S1 normal and S2 normal. No murmur.  Pulmonary:     Effort: Pulmonary effort is normal. No respiratory distress.     Breath sounds: No wheezing.  Chest:     Chest wall: No tenderness.  Abdominal:     General: Bowel sounds are  normal.     Palpations: Abdomen is soft.     Tenderness: There is no abdominal tenderness. There is no guarding or rebound.  Musculoskeletal: Normal range of motion.     Comments: Patient with mild tenderness in the left axilla, posteriorly.  Full movement of the upper extremities without difficulty.  No neck pain.  Skin:    General: Skin is warm and dry.     Coloration: Skin is not pale.  Neurological:     Mental Status: She is  alert.      ED Treatments / Results  Labs (all labs ordered are listed, but only abnormal results are displayed) Labs Reviewed - No data to display  ED ECG REPORT   Date: 05/09/2018  Rate: 78  Rhythm: normal sinus rhythm  QRS Axis: normal  Intervals: normal  ST/T Wave abnormalities: normal  Conduction Disutrbances:none  Narrative Interpretation:   Old EKG Reviewed: unchanged  I have personally reviewed the EKG tracing and agree with the computerized printout as noted.  Radiology No results found.  Procedures Procedures (including critical care time)  Medications Ordered in ED Medications - No data to display   Initial Impression / Assessment and Plan / ED Course  I have reviewed the triage vital signs and the nursing notes.  Pertinent labs & imaging results that were available during my care of the patient were reviewed by me and considered in my medical decision making (see chart for details).        Patient seen and examined.  Patient's pain would be very atypical for ACS.  No PE risk factors or history.  Will check EKG and chest x-ray.  Patient with reproducible soreness on exam at this point.  Suspect MSK pain.  Will reevaluate.  Vital signs reviewed and are as follows: BP 117/75 (BP Location: Right Arm)   Pulse 72   Temp 98.3 F (36.8 C) (Oral)   Resp 18   Ht 5\' 4"  (1.626 m)   Wt 79.4 kg   LMP 05/02/2018   SpO2 100%   Breastfeeding Unknown   BMI 30.04 kg/m   5:35 PM EKG reviewed personally.   6:33 PM chest x-ray  reviewed by myself.  No sign of pneumothorax.  Pt updated on results.   Encouraged use of Tylenol and heat on the area.  Encouraged bland diet.  Encouraged PCP f/u if symptoms are recurrent or nagging.   Patient was counseled to return with severe chest pain, especially if the pain is crushing or pressure-like and spreads to the arms, back, neck, or jaw, or if they have sweating, nausea, or shortness of breath with the pain. They were encouraged to call 911 with these symptoms.   The patient verbalized understanding and agreed.    Final Clinical Impressions(s) / ED Diagnoses   Final diagnoses:  Left-sided chest wall pain   Patient with left-sided axilla pain just prior to arrival.  Occurred after eating.  No central chest pain.  There was worsening pain with movement and reproducible pain on exam.  EKG is completely normal.  Chest x-ray is clear without signs of pneumonia or pneumothorax.  Low concern for ACS given risk factor and history and presentation.  No significant risk factors for PE.  Likely chest wall pain or lower suspicion for GI etiology.  Pt improved upon arrival to ED with residual soreness.   ED Discharge Orders    None       Renne Crigler, PA-C 05/09/18 1836    Rolan Bucco, MD 05/09/18 [phone]

## 2018-05-09 NOTE — Discharge Instructions (Signed)
Please read and follow all provided instructions.  Your diagnoses today include:  1. Left-sided chest wall pain     Tests performed today include:  An EKG of your heart  A chest x-ray  Vital signs. See below for your results today.   Medications prescribed:   None  Take any prescribed medications only as directed.  Follow-up instructions: Please follow-up with your primary care provider as needed for further evaluation of your symptoms.   Return instructions:  SEEK IMMEDIATE MEDICAL ATTENTION IF:  You have severe chest pain, especially if the pain is crushing or pressure-like and spreads to the arms, back, neck, or jaw, or if you have sweating, nausea (feeling sick to your stomach), or shortness of breath. THIS IS AN EMERGENCY. Don't wait to see if the pain will go away. Get medical help at once. Call 911 or [phone] (operator). DO NOT drive yourself to the hospital.   Your chest pain gets worse and does not go away with rest.   You have an attack of chest pain lasting longer than usual, despite rest and treatment with the medications your caregiver has prescribed.   You wake from sleep with chest pain or shortness of breath.  You feel dizzy or faint.  You have chest pain not typical of your usual pain for which you originally saw your caregiver.   You have any other emergent concerns regarding your health.  Additional Information: Chest pain comes from many different causes. Your caregiver has diagnosed you as having chest pain that is not specific for one problem, but does not require admission.  You are at low risk for an acute heart condition or other serious illness.   Your vital signs today were: BP 117/75 (BP Location: Right Arm)    Pulse 72    Temp 98.3 F (36.8 C) (Oral)    Resp 18    Ht 5\' 4"  (1.626 m)    Wt 79.4 kg    LMP 05/02/2018    SpO2 100%    Breastfeeding Unknown    BMI 30.04 kg/m  If your blood pressure (BP) was elevated above 135/85 this visit, please have  this repeated by your doctor within one month. --------------

## 2018-10-13 DIAGNOSIS — F331 Major depressive disorder, recurrent, moderate: Secondary | ICD-10-CM | POA: Insufficient documentation

## 2018-10-13 DIAGNOSIS — B029 Zoster without complications: Secondary | ICD-10-CM | POA: Insufficient documentation

## 2019-04-19 IMAGING — DX CHEST - 2 VIEW
2 series · 2 of 2 positions shown · non-contrast
Comparison: 12/23/2005

CLINICAL DATA: Neck pain

EXAM:
CHEST - 2 VIEW

[chest pa]
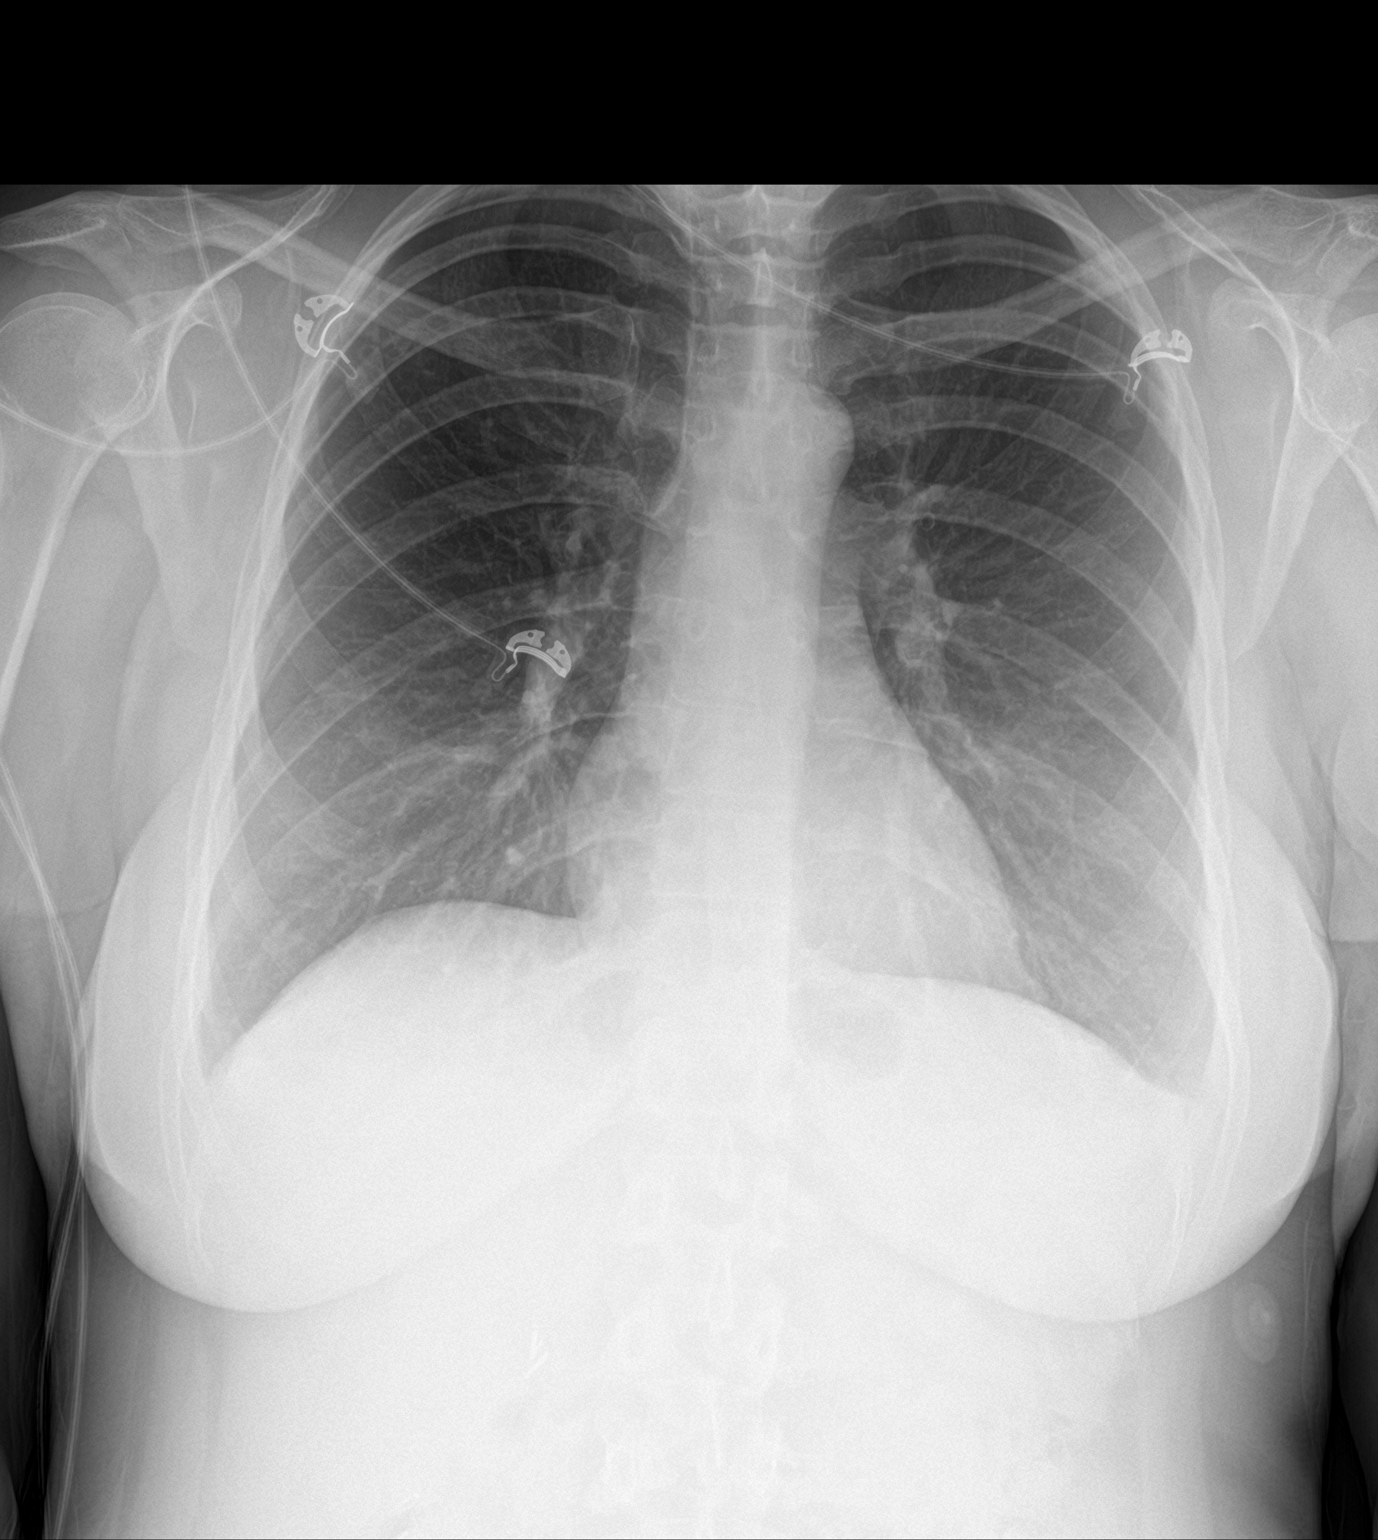

[chest lat]
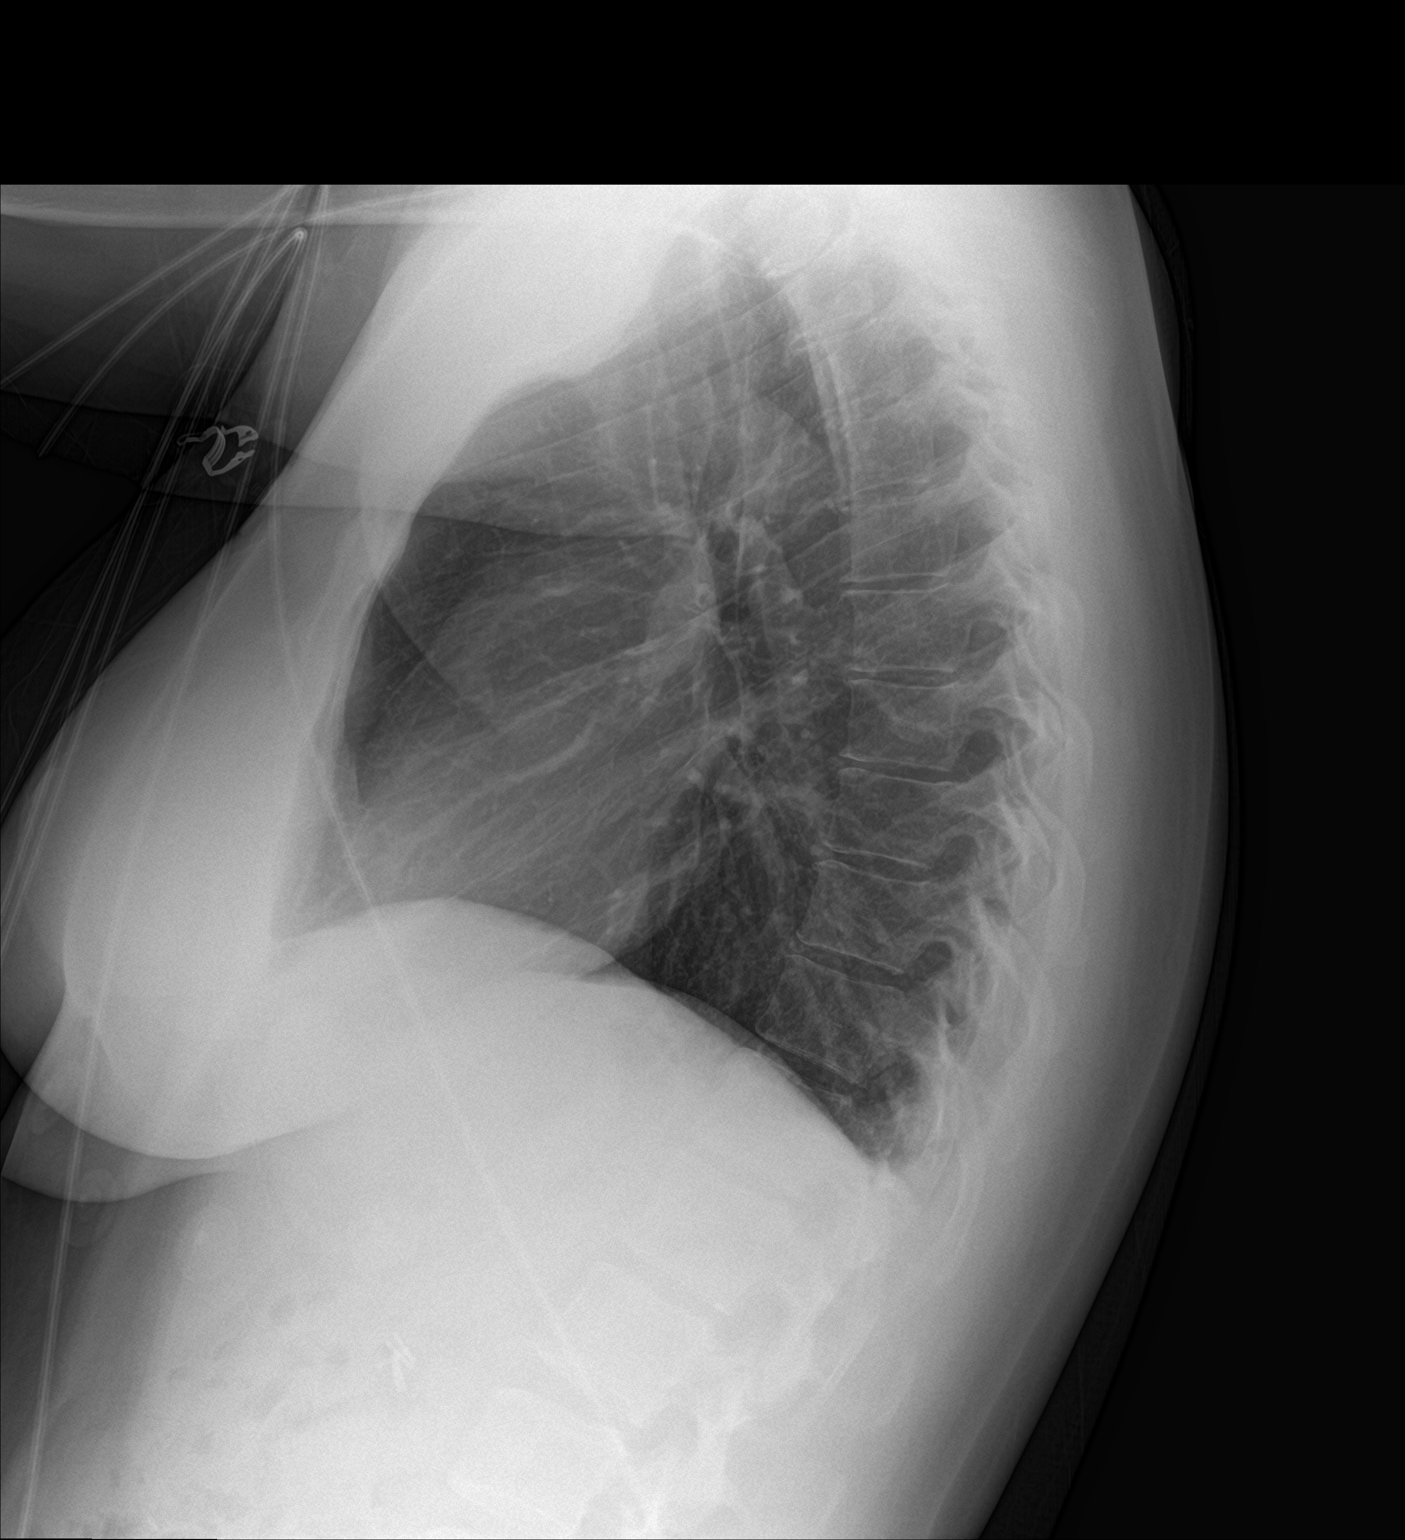

[2 of 2 positions shown; findings below may reference images not displayed]

FINDINGS: Normal heart size. Lungs clear. No pneumothorax. No pleural
effusion.
IMPRESSION: No active cardiopulmonary disease.

## 2021-06-09 DIAGNOSIS — N368 Other specified disorders of urethra: Secondary | ICD-10-CM | POA: Insufficient documentation

## 2021-06-09 DIAGNOSIS — N3946 Mixed incontinence: Secondary | ICD-10-CM | POA: Insufficient documentation

## 2022-12-01 DIAGNOSIS — Z0000 Encounter for general adult medical examination without abnormal findings: Secondary | ICD-10-CM | POA: Diagnosis not present

## 2022-12-01 DIAGNOSIS — Z124 Encounter for screening for malignant neoplasm of cervix: Secondary | ICD-10-CM | POA: Diagnosis not present

## 2023-03-11 DIAGNOSIS — F419 Anxiety disorder, unspecified: Secondary | ICD-10-CM | POA: Diagnosis not present

## 2023-03-11 DIAGNOSIS — Z87891 Personal history of nicotine dependence: Secondary | ICD-10-CM | POA: Diagnosis not present

## 2023-03-11 DIAGNOSIS — F418 Other specified anxiety disorders: Secondary | ICD-10-CM | POA: Diagnosis not present

## 2023-03-11 DIAGNOSIS — I959 Hypotension, unspecified: Secondary | ICD-10-CM | POA: Diagnosis not present

## 2023-03-11 DIAGNOSIS — J309 Allergic rhinitis, unspecified: Secondary | ICD-10-CM | POA: Diagnosis not present

## 2023-03-11 DIAGNOSIS — Z9884 Bariatric surgery status: Secondary | ICD-10-CM | POA: Diagnosis not present

## 2023-03-11 DIAGNOSIS — N368 Other specified disorders of urethra: Secondary | ICD-10-CM | POA: Diagnosis not present

## 2023-03-11 DIAGNOSIS — D251 Intramural leiomyoma of uterus: Secondary | ICD-10-CM | POA: Diagnosis not present

## 2023-03-11 DIAGNOSIS — N361 Urethral diverticulum: Secondary | ICD-10-CM | POA: Diagnosis not present

## 2023-03-11 DIAGNOSIS — F4310 Post-traumatic stress disorder, unspecified: Secondary | ICD-10-CM | POA: Diagnosis not present

## 2023-03-11 DIAGNOSIS — F319 Bipolar disorder, unspecified: Secondary | ICD-10-CM | POA: Diagnosis not present

## 2023-06-03 ENCOUNTER — Ambulatory Visit

## 2023-06-03 VITALS — BP 128/86 | HR 78 | Temp 97.5°F | Ht 64.0 in | Wt 163.4 lb

## 2023-06-03 DIAGNOSIS — M79605 Pain in left leg: Secondary | ICD-10-CM | POA: Insufficient documentation

## 2023-06-03 DIAGNOSIS — Z903 Acquired absence of stomach [part of]: Secondary | ICD-10-CM | POA: Diagnosis not present

## 2023-06-03 MED ORDER — METHOCARBAMOL 750 MG PO TABS: 750.0000 mg | ORAL_TABLET | Freq: Every evening | ORAL | 0 refills | Status: AC | PRN

## 2023-06-03 NOTE — Assessment & Plan Note (Signed)
 Body mass index is 28. She is on weight loss medications including phentermine and Ozempic (semaglutide). 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 She expresses desire to continue medications due to increased hunger without them. Discussed the importance of healthy eating habits and mindful eating to support weight management and overall health.   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 Post-operative recovery appears satisfactory with resolution of urinary symptoms. No complications reported post-surgery.

## 2023-06-03 NOTE — Assessment & Plan Note (Signed)
 Left leg pain persisting for a month, initially associated with a bruise on the left calf. Pain extends from the left thigh to the left foot, exacerbated by lying on the left side. No redness, swelling, or tenderness. Differential diagnosis includes muscle cramp, sciatica, or muscle pull. Blood clot, infection, and varicose veins have been ruled out. Blood work will be conducted to check for electrolyte imbalances and vitamin deficiencies. Muscle relaxants prescribed to assess if symptoms improve, considering the possibility of a muscle pull or cramp.   - Order basic blood work including electrolytes, magnesium levels, and vitamin deficiencies   - Prescribe methocarbamol 750 mg, 15 tablets, to be taken at bedtime as needed for pain

## 2023-06-03 NOTE — Progress Notes (Signed)
 Subjective:  Patient ID: Ana Orr, female    DOB: November 01, 1975  Age: 48 y.o. MRN: [id number]  Chief Complaint  Patient presents with   Establish Care    Patient is establishing as a new patient.  Discussed the use of AI scribe software for clinical note transcription with the patient, who gave verbal consent to proceed.    Ana Orr is a 48 year old female who presents with left leg pain.  She has been experiencing left leg pain for approximately one month. The pain began after noticing a bruise on her left calf, which has since resolved. The pain extends from her left thigh down to her left foot and worsens when lying on her left side. She describes it as similar to a 'pulled muscle' and notes improvement when lying on her back. No redness or swelling is present in the left leg. She reports occasional chest pain and shortness of breath, attributing it to gas. No current back pain, but experienced it recently.  She has a history of gastric sleeve surgery performed in 2013, resulting in a weight loss of approximately 40 to 50 pounds, with a maintained loss of about 30 pounds. She is currently taking Ozempic and phentermine for weight management, with phentermine obtained from a spa. She experiences increased hunger without these medications.  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 Post-surgery, she experienced symptoms similar to a urinary tract infection, but these have since resolved.  She quit smoking in 2016 after smoking intermittently since the age of 48. She reports a significant smoking history but has been smoke-free for nearly nine years. Her family history includes diabetes, stroke, and lung cancer in her mother.      06/03/2023    3:42 PM  Depression screen PHQ 2/9  Decreased Interest 0  Down, Depressed, Hopeless 0  PHQ - 2 Score 0         05/09/2018    5:00 PM  06/03/2023    3:42 PM  Fall Risk  Falls in the past year?  0  Was there an injury with Fall?  0  Fall Risk Category Calculator  0  (RETIRED) Patient Fall Risk Level Low fall risk   Patient at Risk for Falls Due to  No Fall Risks     Current Outpatient Medications on File Prior to Visit  Medication Sig Dispense Refill   cetirizine (ZYRTEC) 10 MG tablet Take 10 mg by mouth daily.     fluticasone (FLONASE) 50 MCG/ACT nasal spray Place 2 sprays into both nostrils daily.     Multiple Vitamin (MULTIVITAMIN) tablet Take 1 tablet by mouth daily.     phentermine (ADIPEX-P) 37.5 MG tablet Take 37.5 mg by mouth daily.     propranolol (INDERAL) 20 MG tablet TAKE 1 TABLET BY MOUTH TWICE A DAY. OFFICE VISIT NEEDED FOR FURTHER REFILLS.     Semaglutide,0.25 or 0.5MG /DOS, (OZEMPIC, 0.25 OR 0.5 MG/DOSE,) 2 MG/3ML SOPN Inject 0.5 mg into the skin once a week.     No current facility-administered medications on file prior to visit.  . Social History   Socioeconomic History   Marital status: Married    Spouse name: Not on file   Number of children: Not on file   Years of education: Not on file   Highest education level: Not on file  Occupational History   Not on file  Tobacco Use   Smoking status:  Former    Current packs/day: 0.00    Types: Cigarettes    Quit date: 11/24/2014    Years since quitting: 8.5   Smokeless tobacco: Not on file  Vaping Use   Vaping status: Never Used  Substance and Sexual Activity   Alcohol use: No   Drug use: No   Sexual activity: Not Currently  Other Topics Concern   Not on file  Social History Narrative   Not on file   Social Drivers of Health   Financial Resource Strain: Low Risk  (06/03/2023)   Overall Financial Resource Strain (CARDIA)    Difficulty of Paying Living Expenses: Not hard at all  Food Insecurity: No Food Insecurity (06/03/2023)   Hunger Vital Sign    Worried About Running Out of Food in the Last Year: Never true    Ran Out of Food in the  Last Year: Never true  Transportation Needs: No Transportation Needs (06/03/2023)   PRAPARE - Administrator, Civil Service (Medical): No    Lack of Transportation (Non-Medical): No  Physical Activity: Inactive (06/03/2023)   Exercise Vital Sign    Days of Exercise per Week: 0 days    Minutes of Exercise per Session: 0 min  Stress: No Stress Concern Present (06/03/2023)   [employer] of Occupational Health - Occupational Stress Questionnaire    Feeling of Stress : Not at all  Social Connections: Moderately Isolated (06/03/2023)   Social Connection and Isolation Panel [NHANES]    Frequency of Communication with Friends and Family: More than three times a week    Frequency of Social Gatherings with Friends and Family: Three times a week    Attends Religious Services: 1 to 4 times per year    Active Member of Clubs or Organizations: No    Attends Banker Meetings: Never    Marital Status: Separated   Past Medical History:  Diagnosis Date   GERD (gastroesophageal reflux disease)    Hypertension    Family History  Problem Relation Age of Onset   Hypertension Mother    Diabetes Mother    Stroke Mother    Hyperlipidemia Mother    Thyroid cancer Mother    Lung cancer Mother    Diabetes Maternal Grandmother    Heart failure Maternal Grandmother    Diabetes Maternal Grandfather    Diabetes Paternal Grandmother    Diabetes Paternal Grandfather    Diabetes Maternal Uncle    Heart failure Maternal Uncle     Review of Systems  Constitutional:  Negative for chills, fatigue and fever.  HENT:  Negative for congestion, ear pain and sore throat.   Respiratory:  Negative for cough and shortness of breath.   Cardiovascular:  Negative for chest pain.  Gastrointestinal:  Negative for abdominal pain, constipation, diarrhea, nausea and vomiting.  Genitourinary:  Negative for dysuria and frequency.  Musculoskeletal:  Positive for myalgias (left leg pain). Negative  for arthralgias.  Neurological:  Negative for dizziness and headaches.  Psychiatric/Behavioral:  Negative for dysphoric mood. The patient is not nervous/anxious.      Objective:  BP 128/86   Pulse 78   Temp (!) 97.5 F (36.4 C)   Ht 5\' 4"  (1.626 m)   Wt 163 lb 6.4 oz (74.1 kg)   SpO2 96%   BMI 28.05 kg/m      06/03/2023    3:37 PM 05/09/2018    4:59 PM 05/09/2018    4:58 PM  BP/Weight  Systolic BP  128  117  Diastolic BP 86  75  Wt. (Lbs) 163.4 175   BMI 28.05 kg/m2 30.04 kg/m2     Physical Exam Vitals and nursing note reviewed.  Constitutional:      Appearance: Normal appearance.  HENT:     Head: Normocephalic and atraumatic.  Eyes:     Pupils: Pupils are equal, round, and reactive to light.  Cardiovascular:     Rate and Rhythm: Normal rate and regular rhythm.  Pulmonary:     Effort: Pulmonary effort is normal.     Breath sounds: Normal breath sounds.  Musculoskeletal:        General: No swelling, tenderness or deformity. Normal range of motion.     Cervical back: Normal range of motion.     Comments: Normal exam of the left lower extremity with normal pulses, no calf redness or tenderness No varicose veins noted No swelling. No low back tenderness  Neurological:     Mental Status: She is alert.    Diabetic Foot Exam - Simple   No data filed      Lab Results  Component Value Date   WBC 7.7 06/21/2015   HGB 14.4 06/21/2015   HCT 42.0 06/21/2015   PLT 207 06/21/2015   GLUCOSE 76 06/21/2013   ALT 43 06/21/2013   AST 21 06/21/2013   NA 137 06/21/2013   K 3.8 06/21/2013   CL 105 06/21/2013   CREATININE 0.53 (L) 06/21/2013   BUN 6 (L) 06/21/2013   CO2 24 06/21/2013      Assessment & Plan:  Left leg pain      Assessment & Plan   Assessment and Plan    Left Leg Pain   Left leg pain persisting for a month, initially associated with a bruise on the left calf. Pain extends from the left thigh to the left foot, exacerbated by lying on the left  side. No redness, swelling, or tenderness. Differential diagnosis includes muscle cramp, sciatica, or muscle pull. Blood clot, infection, and varicose veins have been ruled out. Blood work will be conducted to check for electrolyte imbalances and vitamin deficiencies. Muscle relaxants prescribed to assess if symptoms improve, considering the possibility of a muscle pull or cramp.   - Order basic blood work including electrolytes, magnesium levels, and vitamin deficiencies   - Prescribe methocarbamol 750 mg, 15 tablets, to be taken at bedtime as needed for pain    Obesity   Body mass index is 28. She is on weight loss medications including phentermine and Ozempic (semaglutide). 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 She expresses desire to continue medications due to increased hunger without them. Discussed the importance of healthy eating habits and mindful eating to support weight management and overall health.   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 Post-operative recovery appears satisfactory with resolution of urinary symptoms. No complications reported post-surgery.    General Health Maintenance   Abnormal Pap smears in the past but reports the last Pap smear was normal. Last Pap smear date is unknown. Family history of diabetes, stroke, and lung cancer. Quit smoking in 2016 after approximately 20 years of intermittent smoking.   - Order CMP and additional labs to check vitamin B12, vitamin D, and other potential deficiencies   - Order colonoscopy    Follow-up   Follow-up plans  discussed for routine monitoring and addressing lab results. She will be contacted with lab results and any necessary adjustments to her treatment plan will be made based on findings.   - Follow up in 2-3 months for routine follow-up   -  Review lab results and address any abnormalities   - Ensure she receives colonoscopy results          No orders of the defined types were placed in this encounter.  No orders of the defined types were placed in this encounter.     Follow-up: No follow-ups on file.  AVS was given to patient prior to departure.  [facility] [phone]

## 2023-06-03 NOTE — Patient Instructions (Signed)
 VISIT SUMMARY:  You came in today because of pain in your left leg that has been bothering you for about a month. We discussed your symptoms, including the history of a bruise on your calf and the nature of the pain. We also reviewed your weight management plan and your recent surgery for urethral diverticulum removal. Additionally, we talked about your general health maintenance and family medical history.  YOUR PLAN:  -LEFT LEG PAIN: Your left leg pain may be due to a muscle cramp, sciatica, or a pulled muscle. We have ruled out more serious conditions like blood clots, infections, and varicose veins. We will do some blood tests to check for any imbalances or deficiencies that might be causing your pain. You have been prescribed methocarbamol, a muscle relaxant, to take at bedtime as needed for pain relief.  -OBESITY: Your body mass index (BMI) is 28, which falls into the overweight category. You are currently taking phentermine and Ozempic to help manage your weight. We discussed the importance of maintaining healthy eating habits and mindful eating to support your weight management and overall health. You should continue with your current medications.  -POST-SURGICAL STATUS: You recently had surgery to remove a urethral diverticulum, and your recovery seems to be going well with no complications. Your urinary symptoms have resolved.  -GENERAL HEALTH MAINTENANCE: We reviewed your general health, including your history of abnormal Pap smears, which have been normal recently. We also discussed your family history of diabetes, stroke, and lung cancer, and noted that you quit smoking in 2016. We will do some routine blood tests to check for any vitamin deficiencies and other potential issues. A colonoscopy has also been ordered.  INSTRUCTIONS:  Please follow up in 2-3 months for a routine check-up. We will review your lab results and make any necessary adjustments to your treatment plan. Make sure to  get your colonoscopy done and we will discuss the results during your next visit.

## 2023-06-08 LAB — CBC WITH DIFFERENTIAL/PLATELET
Basophils Absolute: 0.1 10*3/uL (ref 0.0–0.2)
Basos: 1 %
EOS (ABSOLUTE): 0.2 10*3/uL (ref 0.0–0.4)
Eos: 3 %
Hematocrit: 46.6 % (ref 34.0–46.6)
Hemoglobin: 15.7 g/dL (ref 11.1–15.9)
Immature Grans (Abs): 0 10*3/uL (ref 0.0–0.1)
Immature Granulocytes: 0 %
Lymphocytes Absolute: 2.9 10*3/uL (ref 0.7–3.1)
Lymphs: 35 %
MCH: 30.7 pg (ref 26.6–33.0)
MCHC: 33.7 g/dL (ref 31.5–35.7)
MCV: 91 fL (ref 79–97)
Monocytes Absolute: 0.5 10*3/uL (ref 0.1–0.9)
Monocytes: 7 %
Neutrophils Absolute: 4.6 10*3/uL (ref 1.4–7.0)
Neutrophils: 54 %
Platelets: 271 10*3/uL (ref 150–450)
RBC: 5.12 x10E6/uL (ref 3.77–5.28)
RDW: 11.9 % (ref 11.7–15.4)
WBC: 8.3 10*3/uL (ref 3.4–10.8)

## 2023-06-08 LAB — LIPID PANEL
Chol/HDL Ratio: 2.3 ratio (ref 0.0–4.4)
Cholesterol, Total: 170 mg/dL (ref 100–199)
HDL: 73 mg/dL (ref >39)
LDL Chol Calc (NIH): 77 mg/dL (ref 0–99)
Triglycerides: 114 mg/dL (ref 0–149)
VLDL Cholesterol Cal: 20 mg/dL (ref 5–40)

## 2023-06-08 LAB — COMPREHENSIVE METABOLIC PANEL WITH GFR
ALT: 35 IU/L — ABNORMAL HIGH (ref 0–32)
AST: 26 IU/L (ref 0–40)
Albumin: 4.7 g/dL (ref 3.9–4.9)
Alkaline Phosphatase: 75 IU/L (ref 44–121)
BUN/Creatinine Ratio: 12 (ref 9–23)
BUN: 9 mg/dL (ref 6–24)
Bilirubin Total: 0.4 mg/dL (ref 0.0–1.2)
CO2: 21 mmol/L (ref 20–29)
Calcium: 9.7 mg/dL (ref 8.7–10.2)
Chloride: 105 mmol/L (ref 96–106)
Creatinine, Ser: 0.74 mg/dL (ref 0.57–1.00)
Globulin, Total: 3 g/dL (ref 1.5–4.5)
Glucose: 79 mg/dL (ref 70–99)
Potassium: 4.5 mmol/L (ref 3.5–5.2)
Sodium: 142 mmol/L (ref 134–144)
Total Protein: 7.7 g/dL (ref 6.0–8.5)
eGFR: 102 mL/min/{1.73_m2} (ref >59)

## 2023-06-08 LAB — MAGNESIUM: Magnesium: 2.3 mg/dL (ref 1.6–2.3)

## 2023-06-08 LAB — IRON,TIBC AND FERRITIN PANEL
Ferritin: 55 ng/mL (ref 15–150)
Iron Saturation: 22 % (ref 15–55)
Iron: 72 ug/dL (ref 27–159)
Total Iron Binding Capacity: 322 ug/dL (ref 250–450)
UIBC: 250 ug/dL (ref 131–425)

## 2023-06-08 LAB — VITAMIN D 25 HYDROXY (VIT D DEFICIENCY, FRACTURES): Vit D, 25-Hydroxy: 43.3 ng/mL (ref 30.0–100.0)

## 2023-06-08 LAB — VITAMIN B12: Vitamin B-12: 695 pg/mL (ref 232–1245)

## 2023-06-08 LAB — HEPATITIS C ANTIBODY: Hep C Virus Ab: NONREACTIVE

## 2023-06-09 ENCOUNTER — Encounter: Payer: Self-pay | Admitting: Family Medicine

## 2023-06-15 DIAGNOSIS — J320 Chronic maxillary sinusitis: Secondary | ICD-10-CM | POA: Diagnosis not present

## 2023-06-15 DIAGNOSIS — Z792 Long term (current) use of antibiotics: Secondary | ICD-10-CM | POA: Diagnosis not present

## 2023-06-15 DIAGNOSIS — R519 Headache, unspecified: Secondary | ICD-10-CM | POA: Diagnosis not present

## 2023-08-03 DIAGNOSIS — I10 Essential (primary) hypertension: Secondary | ICD-10-CM | POA: Diagnosis not present

## 2023-08-03 DIAGNOSIS — Z0131 Encounter for examination of blood pressure with abnormal findings: Secondary | ICD-10-CM | POA: Diagnosis not present

## 2023-08-03 DIAGNOSIS — F411 Generalized anxiety disorder: Secondary | ICD-10-CM | POA: Diagnosis not present

## 2023-08-03 DIAGNOSIS — Z1389 Encounter for screening for other disorder: Secondary | ICD-10-CM | POA: Diagnosis not present

## 2023-08-03 DIAGNOSIS — Z1331 Encounter for screening for depression: Secondary | ICD-10-CM | POA: Diagnosis not present
